# Patient Record
Sex: Female | Born: 1995 | Hispanic: Yes | Marital: Single
Health system: Southern US, Community
[De-identification: ages and names within clinical notes are randomized; demographics above are authoritative.]

---

## 2019-01-01 ENCOUNTER — Other Ambulatory Visit: Payer: Self-pay

## 2019-01-01 ENCOUNTER — Encounter (HOSPITAL_COMMUNITY): Payer: Self-pay | Admitting: Emergency Medicine

## 2019-01-01 ENCOUNTER — Emergency Department (HOSPITAL_COMMUNITY): Payer: No Typology Code available for payment source

## 2019-01-01 ENCOUNTER — Emergency Department (HOSPITAL_COMMUNITY)
Admission: EM | Admit: 2019-01-01 | Discharge: 2019-01-01 | Disposition: A | Discharge status: Home / Self Care | Payer: No Typology Code available for payment source | Attending: Emergency Medicine | Admitting: Emergency Medicine

## 2019-01-01 DIAGNOSIS — Y939 Activity, unspecified: Secondary | ICD-10-CM | POA: Insufficient documentation

## 2019-01-01 DIAGNOSIS — S299XXA Unspecified injury of thorax, initial encounter: Secondary | ICD-10-CM | POA: Diagnosis not present

## 2019-01-01 DIAGNOSIS — Z23 Encounter for immunization: Secondary | ICD-10-CM | POA: Diagnosis not present

## 2019-01-01 DIAGNOSIS — Y999 Unspecified external cause status: Secondary | ICD-10-CM | POA: Insufficient documentation

## 2019-01-01 DIAGNOSIS — S3991XA Unspecified injury of abdomen, initial encounter: Secondary | ICD-10-CM | POA: Diagnosis not present

## 2019-01-01 DIAGNOSIS — Y929 Unspecified place or not applicable: Secondary | ICD-10-CM | POA: Diagnosis not present

## 2019-01-01 LAB — I-STAT CHEM 8, ED
BUN: 6 mg/dL (ref 6–20)
BUN: 6 mg/dL (ref 6–20)
Calcium, Ion: 1.11 mmol/L — ABNORMAL LOW (ref 1.15–1.40)
Calcium, Ion: 1.15 mmol/L (ref 1.15–1.40)
Chloride: 106 mmol/L (ref 98–111)
Chloride: 105 mmol/L (ref 98–111)
Creatinine, Ser: 0.7 mg/dL (ref 0.44–1.00)
Creatinine, Ser: 0.6 mg/dL (ref 0.44–1.00)
Glucose, Bld: 95 mg/dL (ref 70–99)
Glucose, Bld: 93 mg/dL (ref 70–99)
HCT: 40 % (ref 36.0–46.0)
HCT: 40 % (ref 36.0–46.0)
Hemoglobin: 13.6 g/dL (ref 12.0–15.0)
Hemoglobin: 13.6 g/dL (ref 12.0–15.0)
POTASSIUM: 3.9 mmol/L (ref 3.5–5.1)
Potassium: 3.9 mmol/L (ref 3.5–5.1)
Sodium: 142 mmol/L (ref 135–145)
Sodium: 141 mmol/L (ref 135–145)
TCO2: 25 mmol/L (ref 22–32)
TCO2: 24 mmol/L (ref 22–32)

## 2019-01-01 LAB — URINALYSIS, ROUTINE W REFLEX MICROSCOPIC
Bilirubin Urine: NEGATIVE
Glucose, UA: NEGATIVE mg/dL
HGB URINE DIPSTICK: NEGATIVE
Ketones, ur: NEGATIVE mg/dL
Leukocytes, UA: NEGATIVE
Nitrite: NEGATIVE
Protein, ur: NEGATIVE mg/dL
Specific Gravity, Urine: 1.01 (ref 1.005–1.030)
pH: 6 (ref 5.0–8.0)

## 2019-01-01 LAB — COMPREHENSIVE METABOLIC PANEL
ALK PHOS: 43 U/L (ref 38–126)
ALT: 25 U/L (ref 0–44)
AST: 31 U/L (ref 15–41)
Albumin: 4.2 g/dL (ref 3.5–5.0)
Anion gap: 11 (ref 5–15)
BUN: 6 mg/dL (ref 6–20)
CO2: 23 mmol/L (ref 22–32)
Calcium: 9.1 mg/dL (ref 8.9–10.3)
Chloride: 106 mmol/L (ref 98–111)
Creatinine, Ser: 0.65 mg/dL (ref 0.44–1.00)
GFR calc Af Amer: >60 mL/min (ref >60)
GFR calc non Af Amer: >60 mL/min (ref >60)
Glucose, Bld: 93 mg/dL (ref 70–99)
Potassium: 3.8 mmol/L (ref 3.5–5.1)
Sodium: 140 mmol/L (ref 135–145)
Total Bilirubin: 0.4 mg/dL (ref 0.3–1.2)
Total Protein: 7.6 g/dL (ref 6.5–8.1)

## 2019-01-01 LAB — CBC
HCT: 40.6 % (ref 36.0–46.0)
Hemoglobin: 13 g/dL (ref 12.0–15.0)
MCH: 29.1 pg (ref 26.0–34.0)
MCHC: 32 g/dL (ref 30.0–36.0)
MCV: 90.8 fL (ref 80.0–100.0)
Platelets: 222 10*3/uL (ref 150–400)
RBC: 4.47 MIL/uL (ref 3.87–5.11)
RDW: 12.7 % (ref 11.5–15.5)
WBC: 8.3 10*3/uL (ref 4.0–10.5)
nRBC: 0 % (ref 0.0–0.2)

## 2019-01-01 LAB — CDS SEROLOGY

## 2019-01-01 LAB — PROTIME-INR
INR: 0.92
Prothrombin Time: 12.2 seconds (ref 11.4–15.2)

## 2019-01-01 LAB — SAMPLE TO BLOOD BANK

## 2019-01-01 LAB — HCG, QUANTITATIVE, PREGNANCY: hCG, Beta Chain, Quant, S: <1 m[IU]/mL (ref <5)

## 2019-01-01 LAB — ETHANOL: Alcohol, Ethyl (B): 97 mg/dL — ABNORMAL HIGH (ref <10)

## 2019-01-01 LAB — I-STAT CG4 LACTIC ACID, ED: Lactic Acid, Venous: 1.98 mmol/L — ABNORMAL HIGH (ref 0.5–1.9)

## 2019-01-01 MED ORDER — SODIUM CHLORIDE 0.9 % IV SOLN
INTRAVENOUS | Status: DC
  Administered 2019-01-01: 08:00:00 via INTRAVENOUS

## 2019-01-01 MED ORDER — TETANUS-DIPHTH-ACELL PERTUSSIS 5-2.5-18.5 LF-MCG/0.5 IM SUSP
0.5000 mL | Freq: Once | INTRAMUSCULAR | Status: AC
  Administered 2019-01-01: 0.5 mL via INTRAMUSCULAR
  Filled 2019-01-01: qty 0.5

## 2019-01-01 MED ORDER — IOHEXOL 300 MG/ML  SOLN
100.0000 mL | Freq: Once | INTRAMUSCULAR | Status: AC | PRN
  Administered 2019-01-01: 100 mL via INTRAVENOUS

## 2019-01-01 MED ORDER — FENTANYL CITRATE (PF) 100 MCG/2ML IJ SOLN
50.0000 ug | Freq: Once | INTRAMUSCULAR | Status: AC
  Administered 2019-01-01: 50 ug via INTRAVENOUS
  Filled 2019-01-01: qty 2

## 2019-01-01 MED ORDER — SODIUM CHLORIDE 0.9 % IV BOLUS
1000.0000 mL | Freq: Once | INTRAVENOUS | Status: AC
  Administered 2019-01-01: 1000 mL via INTRAVENOUS
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
